# Patient Record
Sex: Male | Born: 1985 | Hispanic: Yes | Marital: Single | Smoking: Current some day smoker
Health system: Southern US, Community
[De-identification: ages and names within clinical notes are randomized; demographics above are authoritative.]

## PROBLEM LIST (undated history)

## (undated) HISTORY — PX: FRACTURE SURGERY: SHX138

---

## 2014-10-05 ENCOUNTER — Emergency Department (HOSPITAL_COMMUNITY): Payer: Self-pay

## 2014-10-05 ENCOUNTER — Encounter (HOSPITAL_COMMUNITY): Payer: Self-pay | Admitting: Emergency Medicine

## 2014-10-05 ENCOUNTER — Emergency Department (HOSPITAL_COMMUNITY)
Admission: EM | Admit: 2014-10-05 | Discharge: 2014-10-05 | Disposition: A | Discharge status: Home / Self Care | Payer: Self-pay | Attending: Emergency Medicine | Admitting: Emergency Medicine

## 2014-10-05 DIAGNOSIS — R109 Unspecified abdominal pain: Secondary | ICD-10-CM

## 2014-10-05 DIAGNOSIS — Z720 Tobacco use: Secondary | ICD-10-CM | POA: Insufficient documentation

## 2014-10-05 DIAGNOSIS — R1011 Right upper quadrant pain: Secondary | ICD-10-CM | POA: Insufficient documentation

## 2014-10-05 LAB — CBC WITH DIFFERENTIAL/PLATELET
BASOS ABS: 0 10*3/uL (ref 0.0–0.1)
BASOS PCT: 0 % (ref 0–1)
Eosinophils Absolute: 0.1 10*3/uL (ref 0.0–0.7)
Eosinophils Relative: 1 % (ref 0–5)
HEMATOCRIT: 44.4 % (ref 39.0–52.0)
Hemoglobin: 15.6 g/dL (ref 13.0–17.0)
LYMPHS ABS: 2.1 10*3/uL (ref 0.7–4.0)
Lymphocytes Relative: 30 % (ref 12–46)
MCH: 30.8 pg (ref 26.0–34.0)
MCHC: 35.1 g/dL (ref 30.0–36.0)
MCV: 87.7 fL (ref 78.0–100.0)
MONOS PCT: 6 % (ref 3–12)
Monocytes Absolute: 0.4 10*3/uL (ref 0.1–1.0)
Neutro Abs: 4.2 10*3/uL (ref 1.7–7.7)
Neutrophils Relative %: 63 % (ref 43–77)
Platelets: 277 10*3/uL (ref 150–400)
RBC: 5.06 MIL/uL (ref 4.22–5.81)
RDW: 12.7 % (ref 11.5–15.5)
WBC: 6.8 10*3/uL (ref 4.0–10.5)

## 2014-10-05 LAB — URINALYSIS, ROUTINE W REFLEX MICROSCOPIC
Bilirubin Urine: NEGATIVE
GLUCOSE, UA: NEGATIVE mg/dL
Hgb urine dipstick: NEGATIVE
Ketones, ur: NEGATIVE mg/dL
LEUKOCYTES UA: NEGATIVE
Nitrite: NEGATIVE
PH: 8.5 — AB (ref 5.0–8.0)
Protein, ur: NEGATIVE mg/dL
SPECIFIC GRAVITY, URINE: 1.011 (ref 1.005–1.030)
UROBILINOGEN UA: 0.2 mg/dL (ref 0.0–1.0)

## 2014-10-05 LAB — COMPREHENSIVE METABOLIC PANEL
ALT: 30 U/L (ref 17–63)
AST: 28 U/L (ref 15–41)
Albumin: 4.3 g/dL (ref 3.5–5.0)
Alkaline Phosphatase: 41 U/L (ref 38–126)
Anion gap: 10 (ref 5–15)
BUN: 12 mg/dL (ref 6–20)
CO2: 26 mmol/L (ref 22–32)
Calcium: 8.7 mg/dL — ABNORMAL LOW (ref 8.9–10.3)
Chloride: 102 mmol/L (ref 101–111)
Creatinine, Ser: 0.86 mg/dL (ref 0.61–1.24)
GFR calc Af Amer: >60 mL/min (ref >60)
GLUCOSE: 109 mg/dL — AB (ref 65–99)
POTASSIUM: 4.4 mmol/L (ref 3.5–5.1)
SODIUM: 138 mmol/L (ref 135–145)
Total Bilirubin: 0.7 mg/dL (ref 0.3–1.2)
Total Protein: 6.6 g/dL (ref 6.5–8.1)

## 2014-10-05 LAB — LIPASE, BLOOD: Lipase: 24 U/L (ref 22–51)

## 2014-10-05 MED ORDER — OXYCODONE-ACETAMINOPHEN 5-325 MG PO TABS: 1.0000 | ORAL_TABLET | ORAL | Status: AC | PRN

## 2014-10-05 MED ORDER — OXYCODONE-ACETAMINOPHEN 5-325 MG PO TABS
1.0000 | ORAL_TABLET | Freq: Once | ORAL | Status: AC
  Administered 2014-10-05: 1 via ORAL
  Filled 2014-10-05: qty 1

## 2014-10-05 MED ORDER — ONDANSETRON 4 MG PO TBDP: 4.0000 mg | ORAL_TABLET | Freq: Three times a day (TID) | ORAL | Status: AC | PRN

## 2014-10-05 NOTE — ED Notes (Signed)
Patient is alert and orientedx4.  Patient was explained discharge instructions and they understood them with no questions.  The patient's uncle, Ronald Santos is taking the patient home.

## 2014-10-05 NOTE — ED Notes (Signed)
Pt from home for eval right sided abd pain x1 day, pt denies any n/v/d. Pt states took tylenol yesterday with minimal relief but no pain meds today. Denies any fevers at home or any urinary symptoms. Pt states he went to see his pcp for a check up and was told to come here for further work up, no labs drawn at pcp. Pt in nad.

## 2014-10-05 NOTE — ED Provider Notes (Signed)
CSN: [id number]     Arrival date & time 10/05/14  [id number] History   First MD Initiated Contact with Patient 10/05/14 0940     Chief Complaint  Patient presents with  . Abdominal Pain     (Consider location/radiation/quality/duration/timing/severity/associated sxs/prior Treatment) Patient is a 29 y.o. male presenting with abdominal pain. The history is provided by the patient and medical records. The history is limited by a language barrier. A language interpreter was used.  Abdominal Pain  This is a 29 year old male with no significant past medical history presenting to the ED for abdominal pain for the past 24 hours. Patient reports pain is localized to his right upper quadrant, mild in severity. He denies any associated nausea, vomiting, or diarrhea. He has continued eating normally. Last bowel movement was this morning which was normal. He denies urinary symptoms, urethral discharge.  No fever, chills, sweats.  He took Tylenol yesterday without relief.  He was seen by his PCP and encouraged him to the ED for further evaluation. He did not have any workup thus far.  No hx of abdominal surgeries.  History reviewed. No pertinent past medical history. History reviewed. No pertinent past surgical history. No family history on file. History  Substance Use Topics  . Smoking status: Current Some Day Smoker    Types: Cigarettes  . Smokeless tobacco: Not on file  . Alcohol Use: Yes     Comment: daily    Review of Systems  Gastrointestinal: Positive for abdominal pain.  All other systems reviewed and are negative.     Allergies  Review of patient's allergies indicates no known allergies.  Home Medications   Prior to Admission medications   Medication Sig Start Date End Date Taking? Authorizing Provider  acetaminophen (TYLENOL) 325 MG tablet Take 650 mg by mouth every 6 (six) hours as needed (pain).   Yes Historical Provider, MD   BP 142/81 mmHg  Pulse 70  Resp 16  Ht 5\' 8"  (1.727  m)  Wt 208 lb (94.348 kg)  BMI 31.63 kg/m2  SpO2 99%   Physical Exam  Constitutional: He is oriented to person, place, and time. He appears well-developed and well-nourished.  HENT:  Head: Normocephalic and atraumatic.  Mouth/Throat: Oropharynx is clear and moist.  Eyes: Conjunctivae and EOM are normal. Pupils are equal, round, and reactive to light.  Neck: Normal range of motion.  Cardiovascular: Normal rate, regular rhythm and normal heart sounds.   Pulmonary/Chest: Effort normal and breath sounds normal.  Abdominal: Soft. Bowel sounds are normal. There is tenderness (mild) in the right upper quadrant. There is negative Murphy's sign.  Abdomen soft, nondistended, mild tenderness in right upper quadrant without Murphy's sign  Musculoskeletal: Normal range of motion.  Neurological: He is alert and oriented to person, place, and time.  Skin: Skin is warm and dry.  Psychiatric: He has a normal mood and affect.  Nursing note and vitals reviewed.   ED Course  Procedures (including critical care time) Labs Review Labs Reviewed  COMPREHENSIVE METABOLIC PANEL - Abnormal; Notable for the following:    Glucose, Bld 109 (*)    Calcium 8.7 (*)    All other components within normal limits  URINALYSIS, ROUTINE W REFLEX MICROSCOPIC (NOT AT [facility]) - Abnormal; Notable for the following:    pH 8.5 (*)    All other components within normal limits  CBC WITH DIFFERENTIAL/PLATELET  LIPASE, BLOOD    Imaging Review [place] Abdomen Limited  10/05/2014   CLINICAL DATA:  Right  upper quadrant abdominal pain.  EXAM: US ABDOMEN LIMITED - RIGHT UPPER QUADRANT  COMPARISON:  None.  FINDINGS: Gallbladder:  No gallstones or wall thickening visualized. No sonographic Murphy sign noted.  Common bile duct:  Diameter: Normal caliber 3 mm.  Liver:  The liver demonstrates coarse echotexture and increased echogenicity, likely reflecting diffuse steatosis. No overt cirrhotic contour abnormalities or focal lesions are  identified. There is no evidence of intrahepatic biliary ductal dilatation. The portal vein is open.  IMPRESSION: Evidence of diffuse hepatic steatosis. No gallstones or biliary dilatation identified.   Electronically Signed   By: Irish Lack M.D.   On: 10/05/2014 10:47     EKG Interpretation None      MDM   Final diagnoses:  Abdominal pain, unspecified abdominal location   29 year old male with mild right upper quadrant pain for the past 24 hours. No associated N/V/D.  Patient afebrile, nontoxic.  Mild tenderness of RUQ noted without peritonitis.  Lab work reassuring.  [place] with fatty liver, no gallbladder findings.  Patient does admit to drinking approx 40oz of beer daily.  Will d/c home with symptomatic care.  FU with PCP.  Discussed plan with patient, he/she acknowledged understanding and agreed with plan of care.  Return precautions given for new or worsening symptoms.   Garlon Hatchet, PA-C 10/05/14 1143  Purvis Sheffield, MD 10/05/14 1328

## 2014-10-05 NOTE — Discharge Instructions (Signed)
Take the prescribed medication as directed for any pain or nausea. Follow-up with your doctor. Return to the ED for new or worsening symptoms.

## 2014-12-08 ENCOUNTER — Emergency Department (HOSPITAL_COMMUNITY): Payer: Self-pay

## 2014-12-08 ENCOUNTER — Emergency Department (HOSPITAL_COMMUNITY)
Admission: EM | Admit: 2014-12-08 | Discharge: 2014-12-09 | Disposition: A | Discharge status: Home / Self Care | Payer: Self-pay | Attending: Emergency Medicine | Admitting: Emergency Medicine

## 2014-12-08 ENCOUNTER — Encounter (HOSPITAL_COMMUNITY): Payer: Self-pay | Admitting: Emergency Medicine

## 2014-12-08 DIAGNOSIS — S61210A Laceration without foreign body of right index finger without damage to nail, initial encounter: Secondary | ICD-10-CM | POA: Insufficient documentation

## 2014-12-08 DIAGNOSIS — Y929 Unspecified place or not applicable: Secondary | ICD-10-CM | POA: Insufficient documentation

## 2014-12-08 DIAGNOSIS — IMO0002 Reserved for concepts with insufficient information to code with codable children: Secondary | ICD-10-CM

## 2014-12-08 DIAGNOSIS — Y998 Other external cause status: Secondary | ICD-10-CM | POA: Insufficient documentation

## 2014-12-08 DIAGNOSIS — W25XXXA Contact with sharp glass, initial encounter: Secondary | ICD-10-CM | POA: Insufficient documentation

## 2014-12-08 DIAGNOSIS — Z720 Tobacco use: Secondary | ICD-10-CM | POA: Insufficient documentation

## 2014-12-08 DIAGNOSIS — Z23 Encounter for immunization: Secondary | ICD-10-CM | POA: Insufficient documentation

## 2014-12-08 DIAGNOSIS — S51811A Laceration without foreign body of right forearm, initial encounter: Secondary | ICD-10-CM | POA: Insufficient documentation

## 2014-12-08 DIAGNOSIS — S61411A Laceration without foreign body of right hand, initial encounter: Secondary | ICD-10-CM | POA: Insufficient documentation

## 2014-12-08 DIAGNOSIS — S61212A Laceration without foreign body of right middle finger without damage to nail, initial encounter: Secondary | ICD-10-CM | POA: Insufficient documentation

## 2014-12-08 DIAGNOSIS — Y9389 Activity, other specified: Secondary | ICD-10-CM | POA: Insufficient documentation

## 2014-12-08 LAB — I-STAT CHEM 8, ED
BUN: 12 mg/dL (ref 6–20)
CALCIUM ION: 1.04 mmol/L — AB (ref 1.12–1.23)
CHLORIDE: 105 mmol/L (ref 101–111)
Creatinine, Ser: 1.2 mg/dL (ref 0.61–1.24)
Glucose, Bld: 98 mg/dL (ref 65–99)
HCT: 48 % (ref 39.0–52.0)
Hemoglobin: 16.3 g/dL (ref 13.0–17.0)
Potassium: 3.4 mmol/L — ABNORMAL LOW (ref 3.5–5.1)
Sodium: 144 mmol/L (ref 135–145)
TCO2: 19 mmol/L (ref 0–100)

## 2014-12-08 LAB — ETHANOL: ALCOHOL ETHYL (B): 217 mg/dL — AB (ref <5)

## 2014-12-08 MED ORDER — LIDOCAINE-EPINEPHRINE (PF) 2 %-1:200000 IJ SOLN
20.0000 mL | Freq: Once | INTRAMUSCULAR | Status: AC
  Administered 2014-12-09: 20 mL via INTRADERMAL
  Filled 2014-12-08: qty 20

## 2014-12-08 MED ORDER — SODIUM CHLORIDE 0.9 % IV BOLUS (SEPSIS)
1000.0000 mL | Freq: Once | INTRAVENOUS | Status: AC
  Administered 2014-12-08: 1000 mL via INTRAVENOUS

## 2014-12-08 MED ORDER — TETANUS-DIPHTH-ACELL PERTUSSIS 5-2.5-18.5 LF-MCG/0.5 IM SUSP
0.5000 mL | Freq: Once | INTRAMUSCULAR | Status: AC
  Administered 2014-12-09: 0.5 mL via INTRAMUSCULAR
  Filled 2014-12-08: qty 0.5

## 2014-12-08 MED ORDER — LIDOCAINE HCL 2 % IJ SOLN
10.0000 mL | Freq: Once | INTRAMUSCULAR | Status: AC
  Administered 2014-12-09: 200 mg via INTRADERMAL
  Filled 2014-12-08: qty 20
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
## 2014-12-09 MED ORDER — HYDROCODONE-ACETAMINOPHEN 5-325 MG PO TABS: 2.0000 | ORAL_TABLET | ORAL | Status: AC | PRN

## 2014-12-09 NOTE — Discharge Instructions (Signed)
Cuidados de una laceración - Adultos  °(Laceration Care, Adult) ° Una herida cortante es un corte o lesión que atraviesa todas las capas de la piel y el tejido que se encuentra debajo de la piel.  °TRATAMIENTO  °Algunas laceraciones no requieren sutura. Algunas no deben cerrarse debido a que puede aumentar el riesgo de infección. Es importante que consulte al médico lo antes posible después de recibir una lesión para minimizar el riesgo de infección y aumentar la posibilidad de que se cierre con éxito.  °Cuando se cierra adecuadamente, podrán indicarle analgésicos, si los necesita. La herida debe limpiarse para combatir la infección. El médico usará puntos (suturas), grapas,adhesivo, o tiras adhesivas para reparar la laceración. Estos elementos mantendrán unidos los bordes de la piel para que se cure más rápidamente y para un mejor resultado cosmético. Sin embargo, todas las heridas se curarán con una cicatriz. Una vez que la herida se haya curado, las cicatrices pueden minimizarse cubriendo la herida con pantalla solar durante el día por un lapso se 1 año.  °INSTRUCCIONES PARA EL CUIDADO EN EL HOGAR  °Si tiene puntos o grapas:  °· Mantenga la herida limpia y seca. °· Si tiene un (vendaje) cámbielo al menos una vez al día. Cámbielo si se moja o se ensucia, o según las indicaciones del médico. °· Lave el corte dos veces por día con agua y jabón. Enjuáguelo con agua para quitar todo el jabón. Seque dando palmaditas con una toalla limpia y seca. °· Después de limpiar, aplique una delgada capa de una crema con antibiótico según las indicaciones del médico. Esto le ayudará a prevenir las infecciones y a evitar que el vendaje se adhiera. °· Puede ducharse después de las primeras 24 horas. No remoje la herida en agua hasta que le hayan quitado los puntos. °· Solo tome medicamentos que se pueden comprar sin receta o recetados para el dolor, malestar o fiebre, como le indica el médico. °· Concurra para que le retiren los  puntos o las grapas cuando el médico le indique. °En caso que tenga tiras adhesivas:  °· Mantenga la herida limpia y seca. °· No deje que las tiras se mojen. Puede darse un baño cuidando de mantener la herida seca. °· Si se moja, séquela dando palmaditas con una toalla limpia. °· Las tiras caerán por sí mismas. Puede recortar las tiras a medida que la herida se cura. No quite las tiras que están pegadas a la herida. Ellas se caerán cuando sea el momento. °En caso que le hayan aplicado adhesivo.  °· Podrá mojara momentáneamente la herida en la ducha o el baño. No frote ni sumerja la herida. No practique natación. Evite transpirar con abundancia hasta que el adhesivo se haya caído. Después de ducharse o darse un baño, seque el corte dando palmaditas con una toalla limpia. °· No aplique medicamentos líquidos, en crema o ungüentos mientras el adhesivo esté en su lugar. Podrá aflojarlo antes de que la herida se cure. °· Si tiene un vendaje, tenga cuidado de no aplicar cinta adhesiva directamente sobre el adhesivo. Esto puede hacer que el adhesivo se caiga antes de que la herida se haya curado. °· Evite la exposición prolongada a la luz del sol o a la lámpara solar mientras en adhesivo se encuentre en el lugar. La exposición a los rayos ultravioletas durante el primer año oscurecerá la cicatriz. °· El adhesivo permanecerá sobre la piel durante 5 a 10 días y luego caerá naturalmente. No quite la película de adhesivo. °Deberá aplicarse   la vacuna contra el tétanos si: °· No recuerda cuándo se colocó la vacuna la última vez. °· Nunca recibió esta vacuna. °Si le han aplicado la vacuna contra el tétanos, el brazo podrá hincharse, enrojecer y sentirse caliente al tacto. Esto es frecuente y no es un problema. Si usted necesita aplicarse la vacuna y se niega a recibirla, corre riesgo de contraer tétanos. Ésta es una enfermedad grave.  °SOLICITE ATENCIÓN MÉDICA SI:  °· Presenta enrojecimiento, hinchazón o aumento del dolor en la  herida. °· Hay rayas rojas que salen de la herida. °· Observa un líquido blanco amarillento (pus) en la herida. °· Tiene fiebre. °· Advierte un olor fétido que proviene de la herida o del vendaje. °· La herida se abre luego de que le han extraído las suturas. °· Nota que en la herida hay algún cuerpo extraño como un trozo de madera o vidrio. °· La herida está en su mano o pie y observa que no puede mover correctamente los dedos. °SOLICITE ATENCIÓN MÉDICA DE INMEDIATO SI:  °· El dolor no se alivia con los medicamentos. °· Hay una zona muy hinchada alrededor de la herida que le causa dolor y adormecimiento, o advierte un cambio en el color en el brazo, la mano, la pierna o el pie. °· La herida se abre y sangra nuevamente. °· Siente que el adormecimiento, la debilidad o la pérdida de la función de la articulación que rodea la herida empeoran. °· Palpa nódulos dolorosos cerca de la herida o bajo la piel en cualquier zona del cuerpo. °ASEGÚRESE DE QUE:  °· Comprende estas instrucciones. °· Controlará su enfermedad. °· Solicitará ayuda de inmediato si no mejora o si empeora. °Document Released: 04/26/2005 Document Revised: 07/19/2011 °Exit[employer]® Patient Information ©2015 [employer]. This information is not intended to replace advice given to you by your health care provider. Make sure you discuss any questions you have with your health care provider. ° °
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
## 2014-12-17 ENCOUNTER — Ambulatory Visit (INDEPENDENT_AMBULATORY_CARE_PROVIDER_SITE_OTHER): Payer: Self-pay | Admitting: Urgent Care

## 2014-12-17 VITALS — BP 130/80 | HR 88 | Temp 98.9°F | Resp 16 | Ht 66.0 in | Wt 199.0 lb

## 2014-12-17 DIAGNOSIS — Z4802 Encounter for removal of sutures: Secondary | ICD-10-CM

## 2014-12-17 NOTE — Patient Instructions (Signed)
Cuidados de una laceración - Adultos  °(Laceration Care, Adult) ° Una herida cortante es un corte o lesión que atraviesa todas las capas de la piel y el tejido que se encuentra debajo de la piel.  °TRATAMIENTO  °Algunas laceraciones no requieren sutura. Algunas no deben cerrarse debido a que puede aumentar el riesgo de infección. Es importante que consulte al médico lo antes posible después de recibir una lesión para minimizar el riesgo de infección y aumentar la posibilidad de que se cierre con éxito.  °Cuando se cierra adecuadamente, podrán indicarle analgésicos, si los necesita. La herida debe limpiarse para combatir la infección. El médico usará puntos (suturas), grapas,adhesivo, o tiras adhesivas para reparar la laceración. Estos elementos mantendrán unidos los bordes de la piel para que se cure más rápidamente y para un mejor resultado cosmético. Sin embargo, todas las heridas se curarán con una cicatriz. Una vez que la herida se haya curado, las cicatrices pueden minimizarse cubriendo la herida con pantalla solar durante el día por un lapso se 1 año.  °INSTRUCCIONES PARA EL CUIDADO EN EL HOGAR  °Si tiene puntos o grapas:  °· Mantenga la herida limpia y seca. °· Si tiene un (vendaje) cámbielo al menos una vez al día. Cámbielo si se moja o se ensucia, o según las indicaciones del médico. °· Lave el corte dos veces por día con agua y jabón. Enjuáguelo con agua para quitar todo el jabón. Seque dando palmaditas con una toalla limpia y seca. °· Después de limpiar, aplique una delgada capa de una crema con antibiótico según las indicaciones del médico. Esto le ayudará a prevenir las infecciones y a evitar que el vendaje se adhiera. °· Puede ducharse después de las primeras 24 horas. No remoje la herida en agua hasta que le hayan quitado los puntos. °· Solo tome medicamentos que se pueden comprar sin receta o recetados para el dolor, malestar o fiebre, como le indica el médico. °· Concurra para que le retiren los  puntos o las grapas cuando el médico le indique. °En caso que tenga tiras adhesivas:  °· Mantenga la herida limpia y seca. °· No deje que las tiras se mojen. Puede darse un baño cuidando de mantener la herida seca. °· Si se moja, séquela dando palmaditas con una toalla limpia. °· Las tiras caerán por sí mismas. Puede recortar las tiras a medida que la herida se cura. No quite las tiras que están pegadas a la herida. Ellas se caerán cuando sea el momento. °En caso que le hayan aplicado adhesivo.  °· Podrá mojara momentáneamente la herida en la ducha o el baño. No frote ni sumerja la herida. No practique natación. Evite transpirar con abundancia hasta que el adhesivo se haya caído. Después de ducharse o darse un baño, seque el corte dando palmaditas con una toalla limpia. °· No aplique medicamentos líquidos, en crema o ungüentos mientras el adhesivo esté en su lugar. Podrá aflojarlo antes de que la herida se cure. °· Si tiene un vendaje, tenga cuidado de no aplicar cinta adhesiva directamente sobre el adhesivo. Esto puede hacer que el adhesivo se caiga antes de que la herida se haya curado. °· Evite la exposición prolongada a la luz del sol o a la lámpara solar mientras en adhesivo se encuentre en el lugar. La exposición a los rayos ultravioletas durante el primer año oscurecerá la cicatriz. °· El adhesivo permanecerá sobre la piel durante 5 a 10 días y luego caerá naturalmente. No quite la película de adhesivo. °Deberá aplicarse   la vacuna contra el tétanos si: °· No recuerda cuándo se colocó la vacuna la última vez. °· Nunca recibió esta vacuna. °Si le han aplicado la vacuna contra el tétanos, el brazo podrá hincharse, enrojecer y sentirse caliente al tacto. Esto es frecuente y no es un problema. Si usted necesita aplicarse la vacuna y se niega a recibirla, corre riesgo de contraer tétanos. Ésta es una enfermedad grave.  °SOLICITE ATENCIÓN MÉDICA SI:  °· Presenta enrojecimiento, hinchazón o aumento del dolor en la  herida. °· Hay rayas rojas que salen de la herida. °· Observa un líquido blanco amarillento (pus) en la herida. °· Tiene fiebre. °· Advierte un olor fétido que proviene de la herida o del vendaje. °· La herida se abre luego de que le han extraído las suturas. °· Nota que en la herida hay algún cuerpo extraño como un trozo de madera o vidrio. °· La herida está en su mano o pie y observa que no puede mover correctamente los dedos. °SOLICITE ATENCIÓN MÉDICA DE INMEDIATO SI:  °· El dolor no se alivia con los medicamentos. °· Hay una zona muy hinchada alrededor de la herida que le causa dolor y adormecimiento, o advierte un cambio en el color en el brazo, la mano, la pierna o el pie. °· La herida se abre y sangra nuevamente. °· Siente que el adormecimiento, la debilidad o la pérdida de la función de la articulación que rodea la herida empeoran. °· Palpa nódulos dolorosos cerca de la herida o bajo la piel en cualquier zona del cuerpo. °ASEGÚRESE DE QUE:  °· Comprende estas instrucciones. °· Controlará su enfermedad. °· Solicitará ayuda de inmediato si no mejora o si empeora. °Document Released: 04/26/2005 Document Revised: 07/19/2011 °[employer]® Patient Information ©2015 [employer]. This information is not intended to replace advice given to you by your health care provider. Make sure you discuss any questions you have with your health care provider. ° °

## 2014-12-17 NOTE — Progress Notes (Signed)
   Patient: Ronald Santos [id number]  Subjective: Ronald Santos is returning for suture removal. Patient was initially seen 12/08/2014 and had sutures placed over left forearm and right 2nd and 3rd fingers. Denies fever, drainage of pus or blood, wound dehiscence, edema, pain.   Objective: BP 130/80 mmHg  Pulse 88  Temp(Src) 98.9 F (37.2 C) (Oral)  Resp 16  Ht  (1.676 m)  Wt 199 lb (90.266 kg)  BMI 32.13 kg/m2  SpO2 98%  Physical Exam  Constitutional: He is oriented to person, place, and time and well-developed, well-nourished, and in no distress.  Cardiovascular: Normal rate.   Pulmonary/Chest: Effort normal.  Musculoskeletal: Normal range of motion. He exhibits no edema or tenderness.       Left wrist: He exhibits laceration. He exhibits normal range of motion, no tenderness, no bony tenderness, no swelling and no effusion.       Arms:      Hands: Neurological: He is alert and oriented to person, place, and time.  Skin: Skin is warm and dry. No rash noted. No erythema. No pallor.   Sutures removed without incident. Patient tolerated this well. Steri strips applied to left forearm (ulnar side).  Assessment and Plan: Well-healed wound. Anticipatory guidance provided. Return to clinic as needed.  Wallis Bamberg, PA-C [facility] [phone] 12/17/2014  9:39 AM
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
## 2021-09-20 ENCOUNTER — Encounter (HOSPITAL_COMMUNITY): Payer: Self-pay | Admitting: Emergency Medicine

## 2021-09-20 ENCOUNTER — Emergency Department (HOSPITAL_COMMUNITY)
Admission: EM | Admit: 2021-09-20 | Discharge: 2021-09-20 | Disposition: A | Discharge status: Home / Self Care | Payer: Self-pay | Attending: Emergency Medicine | Admitting: Emergency Medicine

## 2021-09-20 ENCOUNTER — Other Ambulatory Visit: Payer: Self-pay

## 2021-09-20 DIAGNOSIS — K625 Hemorrhage of anus and rectum: Secondary | ICD-10-CM | POA: Insufficient documentation

## 2021-09-20 LAB — COMPREHENSIVE METABOLIC PANEL
ALT: 34 U/L (ref 0–44)
AST: 28 U/L (ref 15–41)
Albumin: 4.3 g/dL (ref 3.5–5.0)
Alkaline Phosphatase: 50 U/L (ref 38–126)
Anion gap: 8 (ref 5–15)
BUN: 9 mg/dL (ref 6–20)
CO2: 21 mmol/L — ABNORMAL LOW (ref 22–32)
Calcium: 8.1 mg/dL — ABNORMAL LOW (ref 8.9–10.3)
Chloride: 110 mmol/L (ref 98–111)
Creatinine, Ser: 0.76 mg/dL (ref 0.61–1.24)
GFR, Estimated: >60 mL/min (ref >60)
Glucose, Bld: 115 mg/dL — ABNORMAL HIGH (ref 70–99)
Potassium: 3.5 mmol/L (ref 3.5–5.1)
Sodium: 139 mmol/L (ref 135–145)
Total Bilirubin: 0.4 mg/dL (ref 0.3–1.2)
Total Protein: 7.8 g/dL (ref 6.5–8.1)

## 2021-09-20 LAB — TYPE AND SCREEN
ABO/RH(D): A POS
Antibody Screen: NEGATIVE

## 2021-09-20 LAB — CBC
HCT: 43.5 % (ref 39.0–52.0)
Hemoglobin: 15.3 g/dL (ref 13.0–17.0)
MCH: 30.5 pg (ref 26.0–34.0)
MCHC: 35.2 g/dL (ref 30.0–36.0)
MCV: 86.7 fL (ref 80.0–100.0)
Platelets: 348 10*3/uL (ref 150–400)
RBC: 5.02 MIL/uL (ref 4.22–5.81)
RDW: 12.6 % (ref 11.5–15.5)
WBC: 7.9 10*3/uL (ref 4.0–10.5)
nRBC: 0 % (ref 0.0–0.2)

## 2021-09-20 LAB — POC OCCULT BLOOD, ED: Fecal Occult Bld: POSITIVE — AB

## 2021-09-20 NOTE — Discharge Instructions (Signed)
Begin taking Metamucil, 1 heaping teaspoon in a glass of water 3 times daily. ? ?Begin taking Colace 100 mg twice daily.  This medication can be purchased over-the-counter and without a prescription. ? ?If your bleeding persists, follow-up with gastroenterology.  The contact information for Dr. Marletta Lor has been provided in this discharge summary for you to call and make these arrangements. ?

## 2021-09-20 NOTE — ED Triage Notes (Addendum)
Brought in by EMS for dark and stringy stools when wiping and abdominal pain. Pt with hx ETOH abuse. Pt states it hurts to sit and that he feels a pulsing at his rectum. ?

## 2021-09-20 NOTE — ED Provider Notes (Signed)
?  [facility] ?Provider Note ? ? ?CSN: [id number] ?Arrival date & time: 09/20/21  0104 ? ?  ? ?History ? ?Chief Complaint  ?Patient presents with  ? Rectal Bleeding  ? ? ?Ronald Santos is a 36 y.o. male. ? ?Patient is a 36 year old male with no significant past medical history.  He presents today with complaints of rectal bleeding.  He had a bowel movement this morning, then wiped and noticed bright blood on the toilet paper.  This occurred again this evening and he presents for evaluation of this.  He does report rectal pain and recent passage of hard stools.  He denies any fevers or chills. ? ?The history is provided by the patient.  ? ?  ? ?Home Medications ?Prior to Admission medications   ?Medication Sig Start Date End Date Taking? Authorizing Provider  ?HYDROcodone-acetaminophen (NORCO/VICODIN) 5-325 MG per tablet Take 2 tablets by mouth every 4 (four) hours as needed. ?Patient not taking: Reported on 12/17/2014 12/09/14   Danelle Berry, PA-C  ?ondansetron (ZOFRAN ODT) 4 MG disintegrating tablet Take 1 tablet (4 mg total) by mouth every 8 (eight) hours as needed for nausea. ?Patient not taking: Reported on 12/08/2014 10/05/14   Garlon Hatchet, PA-C  ?oxyCODONE-acetaminophen (PERCOCET/ROXICET) 5-325 MG per tablet Take 1 tablet by mouth every 4 (four) hours as needed. ?Patient not taking: Reported on 12/08/2014 10/05/14   Garlon Hatchet, PA-C  ?   ? ?Allergies    ?Patient has no known allergies.   ? ?Review of Systems   ?Review of Systems  ?All other systems reviewed and are negative. ? ?Physical Exam ?Updated Vital Signs ?BP 122/79 (BP Location: Right Arm)   Pulse (!) 110   Temp 98.3 ?F (36.8 ?C) (Oral)   Resp 16   Ht 5\' 6"  (1.676 m)   Wt 90.3 kg   SpO2 98%   BMI 32.13 kg/m?  ?Physical Exam ?Vitals and nursing note reviewed.  ?Constitutional:   ?   Appearance: Normal appearance.  ?Pulmonary:  ?   Effort: Pulmonary effort is normal.  ?Genitourinary: ?   Comments: Rectal examination reveals no  obvious hemorrhoids.  There is significant tenderness with an attempt at a digital rectal exam and exam somewhat limited.  There is scant blood streaking the glove after the exam ?Skin: ?   General: Skin is warm and dry.  ?Neurological:  ?   Mental Status: He is alert.  ? ? ?ED Results / Procedures / Treatments   ?Labs ?(all labs ordered are listed, but only abnormal results are displayed) ?Labs Reviewed  ?CBC  ?COMPREHENSIVE METABOLIC PANEL  ?POC OCCULT BLOOD, ED  ?TYPE AND SCREEN  ? ? ?EKG ?None ? ?Radiology ?No results found. ? ?Procedures ?Procedures  ? ? ?Medications Ordered in ED ?Medications - No data to display ? ?ED Course/ Medical Decision Making/ A&P ? ?Patient presenting with rectal bleeding that I suspect is related to a fissure.  He has exquisite tenderness with rectal examination.  This only occurs when he has a bowel movement.  His bowel movements are stool and bleeding is noted when he wipes. ? ?Patient to be discharged with stool softeners, fiber supplementation, and follow-up with gastroenterology if not improving. ? ?Final Clinical Impression(s) / ED Diagnoses ?Final diagnoses:  ?None  ? ? ?Rx / DC Orders ?ED Discharge Orders   ? ? None  ? ?  ? ? ?  ? , MD ?09/20/21 [phone] ? ?
